# Patient Record
Sex: Male | Born: 1965 | Race: Black or African American | Hispanic: No | Smoking: Current every day smoker
Health system: Southern US, Community
[De-identification: ages and names within clinical notes are randomized; demographics above are authoritative.]

---

## 2010-10-02 ENCOUNTER — Emergency Department: Payer: Self-pay | Admitting: Emergency Medicine

## 2015-01-16 ENCOUNTER — Emergency Department: Payer: BLUE CROSS/BLUE SHIELD

## 2015-01-16 ENCOUNTER — Emergency Department
Admission: EM | Admit: 2015-01-16 | Discharge: 2015-01-16 | Disposition: A | Discharge status: Home / Self Care | Payer: BLUE CROSS/BLUE SHIELD | Attending: Student | Admitting: Student

## 2015-01-16 ENCOUNTER — Encounter: Payer: Self-pay | Admitting: *Deleted

## 2015-01-16 ENCOUNTER — Other Ambulatory Visit: Payer: Self-pay

## 2015-01-16 DIAGNOSIS — Z23 Encounter for immunization: Secondary | ICD-10-CM | POA: Insufficient documentation

## 2015-01-16 DIAGNOSIS — Y998 Other external cause status: Secondary | ICD-10-CM | POA: Insufficient documentation

## 2015-01-16 DIAGNOSIS — S0990XA Unspecified injury of head, initial encounter: Secondary | ICD-10-CM | POA: Diagnosis not present

## 2015-01-16 DIAGNOSIS — S0031XA Abrasion of nose, initial encounter: Secondary | ICD-10-CM | POA: Diagnosis not present

## 2015-01-16 DIAGNOSIS — R52 Pain, unspecified: Secondary | ICD-10-CM

## 2015-01-16 DIAGNOSIS — W010XXA Fall on same level from slipping, tripping and stumbling without subsequent striking against object, initial encounter: Secondary | ICD-10-CM | POA: Diagnosis not present

## 2015-01-16 DIAGNOSIS — Y92524 Gas station as the place of occurrence of the external cause: Secondary | ICD-10-CM | POA: Insufficient documentation

## 2015-01-16 DIAGNOSIS — R55 Syncope and collapse: Secondary | ICD-10-CM

## 2015-01-16 DIAGNOSIS — W19XXXA Unspecified fall, initial encounter: Secondary | ICD-10-CM

## 2015-01-16 DIAGNOSIS — Y9301 Activity, walking, marching and hiking: Secondary | ICD-10-CM | POA: Diagnosis not present

## 2015-01-16 DIAGNOSIS — S0081XA Abrasion of other part of head, initial encounter: Secondary | ICD-10-CM | POA: Insufficient documentation

## 2015-01-16 DIAGNOSIS — Z720 Tobacco use: Secondary | ICD-10-CM | POA: Diagnosis not present

## 2015-01-16 LAB — CBC
HEMATOCRIT: 51.2 % (ref 40.0–52.0)
Hemoglobin: 17.2 g/dL (ref 13.0–18.0)
MCH: 29.1 pg (ref 26.0–34.0)
MCHC: 33.5 g/dL (ref 32.0–36.0)
MCV: 86.7 fL (ref 80.0–100.0)
Platelets: 149 10*3/uL — ABNORMAL LOW (ref 150–440)
RBC: 5.9 MIL/uL (ref 4.40–5.90)
RDW: 16.6 % — ABNORMAL HIGH (ref 11.5–14.5)
WBC: 4.6 10*3/uL (ref 3.8–10.6)

## 2015-01-16 LAB — BASIC METABOLIC PANEL
Anion gap: 9 (ref 5–15)
BUN: 18 mg/dL (ref 6–20)
CALCIUM: 9.1 mg/dL (ref 8.9–10.3)
CHLORIDE: 104 mmol/L (ref 101–111)
CO2: 23 mmol/L (ref 22–32)
CREATININE: 1.41 mg/dL — AB (ref 0.61–1.24)
GFR calc Af Amer: >60 mL/min (ref >60)
GFR calc non Af Amer: 57 mL/min — ABNORMAL LOW (ref >60)
GLUCOSE: 193 mg/dL — AB (ref 65–99)
Potassium: 3.8 mmol/L (ref 3.5–5.1)
SODIUM: 136 mmol/L (ref 135–145)

## 2015-01-16 LAB — TROPONIN I

## 2015-01-16 MED ORDER — TETANUS-DIPHTH-ACELL PERTUSSIS 5-2.5-18.5 LF-MCG/0.5 IM SUSP
0.5000 mL | Freq: Once | INTRAMUSCULAR | Status: AC
Start: 2015-01-16 — End: 2015-01-16
  Administered 2015-01-16: 0.5 mL via INTRAMUSCULAR
  Filled 2015-01-16: qty 0.5

## 2015-01-16 MED ORDER — IBUPROFEN 600 MG PO TABS: 600.0000 mg | ORAL_TABLET | Freq: Four times a day (QID) | ORAL | Status: AC | PRN

## 2015-01-16 MED ORDER — SODIUM CHLORIDE 0.9 % IV BOLUS (SEPSIS)
1000.0000 mL | Freq: Once | INTRAVENOUS | Status: AC
  Administered 2015-01-16: 1000 mL via INTRAVENOUS

## 2015-01-16 NOTE — ED Provider Notes (Signed)
[facility] Emergency Department Provider Note  ____________________________________________  Time seen: Approximately 3:05 PM  I have reviewed the triage vital signs and the nursing notes.   HISTORY  Chief Complaint Loss of Consciousness    HPI Curtis Castaneda is a 49 y.o. male with no chronic medical problems presents for evaluation of syncope and fall which occurred suddenly just prior to arrival. Patient reports that he donated plasma earlier today. He left the donation facility, was walking into a gas station to purchase Gatorade and while he was walking began feeling lightheaded, "hot all over" with tunnel vision. He fainted, fell and hit his head and right face. Currently complaining of head/face pain. No chest pain, difficulty breathing or history of fainting in the past. Current severity of pain is mild. He is not eaten all day and has had minimal fluids to drink. He denies any abdominal pain, no recent illness including no fevers, chills. No modifying factors. Prior to today he had been in his usual state of health. Per report, he was hypotensive on EMS arrival however this improved with normal saline.   History reviewed. No pertinent past medical history.  There are no active problems to display for this patient.   History reviewed. No pertinent past surgical history.  No current outpatient prescriptions on file.  Allergies Review of patient's allergies indicates no known allergies.  History reviewed. No pertinent family history.  Social History History  Substance Use Topics  . Smoking status: Current Every Day Smoker -- 0.50 packs/day    Types: Cigarettes  . Smokeless tobacco: Not on file  . Alcohol Use: 0.6 oz/week    1 Cans of beer per week    Review of Systems Constitutional: No fever/chills Eyes: No visual changes. ENT: No sore throat. Cardiovascular: Denies chest pain. Respiratory: Denies shortness of breath. Gastrointestinal: No  abdominal pain.  No nausea, no vomiting.  No diarrhea.  No constipation. Genitourinary: Negative for dysuria. Musculoskeletal: Negative for back pain. Skin: Negative for rash. Neurological: Positive for moderate headache, no focal weakness or numbness.  10-point ROS otherwise negative.  ____________________________________________   PHYSICAL EXAM:  VITAL SIGNS: ED Triage Vitals  Enc Vitals Group     BP --      Pulse --      Resp --      Temp --      Temp src --      SpO2 01/16/15 1446 94 %     Weight --      Height --      Head Cir --      Peak Flow --      Pain Score 01/16/15 1454 3     Pain Loc --      Pain Edu? --      Excl. in GC? --     Constitutional: Alert and oriented x 4. Well appearing and in no acute distress. C-collar in place. Eyes: Conjunctivae are normal. PERRL. EOMI. Head: Abrasions and swelling associated with the anterior chin, abrasion just inferior to the nose. Nose: No congestion/rhinnorhea. Mouth/Throat: Mucous membranes are moist.  Oropharynx non-erythematous. No malocclusion. Chronically poor dentition with several teeth which are loose however patient reports this is chronic. Neck: No stridor.  No cervical spine tenderness to palpation. Cardiovascular: Normal rate, regular rhythm. Grossly normal heart sounds.  Good peripheral circulation. Respiratory: Normal respiratory effort.  No retractions. Lungs CTAB. Gastrointestinal: Soft and nontender. No distention. No abdominal bruits. No CVA tenderness. Genitourinary: deferred Musculoskeletal: No  lower extremity tenderness nor edema.  No joint effusions. Superficial abrasions to the palm of the left hand with mild associated tenderness. Neurologic:  Normal speech and language. No gross focal neurologic deficits are appreciated. 5 out of 5 strength in bilateral upper and lower extremities, sensation intact to light touch throughout. Skin:  Skin is warm, dry and intact. No rash noted. Psychiatric: Mood  and affect are normal. Speech and behavior are normal.  ____________________________________________   LABS (all labs ordered are listed, but only abnormal results are displayed)  Labs Reviewed  BASIC METABOLIC PANEL - Abnormal; Notable for the following:    Glucose, Bld 193 (*)    Creatinine, Ser 1.41 (*)    GFR calc non Af Amer 57 (*)    All other components within normal limits  CBC - Abnormal; Notable for the following:    RDW 16.6 (*)    Platelets 149 (*)    All other components within normal limits  URINALYSIS COMPLETEWITH MICROSCOPIC ([facility] ONLY)  TROPONIN I   ____________________________________________  EKG  ED ECG REPORT I, Gayla Doss, the attending physician, personally viewed and interpreted this ECG.   Date: 01/16/2015  EKG Time: 14:54  Rate: 84  Rhythm: normal sinus rhythm  Axis: Normal axis  Intervals:none  ST&T Change: No acute ST segment elevation, normal QTC. Nonspecific T-wave inversion in lead 3  ____________________________________________  RADIOLOGY  CT head, c-spine, maxillofacial IMPRESSION: 1. Normal CT appearance the brain. 2. Soft tissue swelling anterior to the mandible without an underlying fracture or dislocation. 3. No acute fracture of the face. 4. Dental caries and periodontal disease as described. 5. Mild degenerative changes in the cervical spine at C5-6 without significant focal stenosis.   CXR  FINDINGS: The heart size and mediastinal contours are within normal limits. Both lungs are clear. The visualized skeletal structures are unremarkable.  IMPRESSION: No active disease.    Xray left hand FINDINGS: The hand and wrist are located. No acute bone or soft tissue abnormality is present. Mild degenerative changes are noted at the DIP of the left index finger.  IMPRESSION: 1. No acute abnormality. 2. Minimal degenerative change.  ____________________________________________   PROCEDURES  Procedure(s)  performed: None  Critical Care performed: No  ____________________________________________   INITIAL IMPRESSION / ASSESSMENT AND PLAN / ED COURSE  Pertinent labs & imaging results that were available during my care of the patient were reviewed by me and considered in my medical decision making (see chart for details).  Curtis Castaneda is a 49 y.o. male with no chronic medical problems presents for evaluation of syncope and fall which occurred suddenly just prior to arrival. On exam, he is well-appearing and in no acute distress. Vital signs stable, he is afebrile. He has superficial hemostatic abrasion associated with the chin and the left hand however his exam is otherwise benign. CT head, C-spine, maxillofacial negative for any acute traumatic pathology. C-collar cleared. Suspect dehydration with vasovagal syncope in the setting of orthostatic hypotension given poor oral intake today, plasma donation. No chest pain, no difficulty breathing, no card arrhythmia noted on monitor and I doubt purely cardiogenic syncope. Additionally, he has an intact neurological exam and I doubt purely neurogenic syncope. IV fluids given for mild creatinine elevation and suspected dehydration. Troponin negative. Patient reports he feels much better at this time. Encouraged adequate oral hydration. Tetanus updated. DC with return precautions, PCP follow-up. He is comfortable with the discharge plan. ____________________________________________   FINAL CLINICAL IMPRESSION(S) / ED DIAGNOSES  Final diagnoses:  Fall  Syncope, vasovagal  Head injury without skull fracture, initial encounter      Gayla Doss, MD 01/16/15 [phone]

## 2015-01-16 NOTE — ED Notes (Signed)
Pt gave plasma earlier today, was walking to the store to get some Gatorade, just before the store the pt syncope out hitting rt jaw and face.  C-Collar in place per EMS, 18G PIV NS given, EMS reports SBP in the low 60's,  Pt is currently 99/71, abrasions noted to chin, bleeding controlled. Teeth appear to be in proper place.

## 2016-06-24 ENCOUNTER — Emergency Department
Admission: EM | Admit: 2016-06-24 | Discharge: 2016-06-24 | Disposition: A | Discharge status: Home / Self Care | Payer: BLUE CROSS/BLUE SHIELD | Attending: Student in an Organized Health Care Education/Training Program | Admitting: Student in an Organized Health Care Education/Training Program

## 2016-06-24 DIAGNOSIS — R55 Syncope and collapse: Secondary | ICD-10-CM

## 2016-06-24 DIAGNOSIS — E860 Dehydration: Secondary | ICD-10-CM

## 2016-06-24 DIAGNOSIS — F17210 Nicotine dependence, cigarettes, uncomplicated: Secondary | ICD-10-CM | POA: Insufficient documentation

## 2016-06-24 LAB — BASIC METABOLIC PANEL
Anion gap: 7 (ref 5–15)
BUN: 14 mg/dL (ref 6–20)
CHLORIDE: 108 mmol/L (ref 101–111)
CO2: 26 mmol/L (ref 22–32)
Calcium: 8.7 mg/dL — ABNORMAL LOW (ref 8.9–10.3)
Creatinine, Ser: 1.39 mg/dL — ABNORMAL HIGH (ref 0.61–1.24)
GFR calc Af Amer: >60 mL/min (ref >60)
GFR calc non Af Amer: 57 mL/min — ABNORMAL LOW (ref >60)
Glucose, Bld: 117 mg/dL — ABNORMAL HIGH (ref 65–99)
Potassium: 4.4 mmol/L (ref 3.5–5.1)
Sodium: 141 mmol/L (ref 135–145)

## 2016-06-24 LAB — CBC
HCT: 52.9 % — ABNORMAL HIGH (ref 40.0–52.0)
Hemoglobin: 17.6 g/dL (ref 13.0–18.0)
MCH: 29.9 pg (ref 26.0–34.0)
MCHC: 33.3 g/dL (ref 32.0–36.0)
MCV: 90 fL (ref 80.0–100.0)
PLATELETS: 167 10*3/uL (ref 150–440)
RBC: 5.88 MIL/uL (ref 4.40–5.90)
RDW: 15.8 % — AB (ref 11.5–14.5)
WBC: 6.4 10*3/uL (ref 3.8–10.6)

## 2016-06-24 MED ORDER — SODIUM CHLORIDE 0.9 % IV BOLUS (SEPSIS)
1000.0000 mL | Freq: Once | INTRAVENOUS | Status: AC
  Administered 2016-06-24: 1000 mL via INTRAVENOUS

## 2016-06-24 NOTE — ED Provider Notes (Signed)
[facility] Emergency Department Provider Note    First MD Initiated Contact with Patient 06/24/16 1730     (approximate)  I have reviewed the triage vital signs and the nursing notes.   HISTORY  Chief Complaint Loss of Consciousness    HPI Curtis Castaneda is a 51 y.o. male presents with lightheadedness and near syncopal event after giving plasma today around 2 PM. Patient states he typically gives plasma roughly 2 times weekly. Has had similar episodes in the past. States that when he was standing up at work patient felt lightheaded with blurry vision. A coworker saw him become pale and looked as though he is about to pass out and he helped him to the floor. Once lying down he had improvement in his symptoms and regained full consciousness. He denied any chest pain or shortness of breath. No abdominal pain. Denies any numbness or tingling. There was no full loss of consciousness or head injury.   PMH:  No h/o renal failure FMH: no bleeding disorders [facility]: no recent surgeries There are no active problems to display for this patient.     Prior to Admission medications   Medication Sig Start Date End Date Taking? Authorizing Provider  acetaminophen (TYLENOL) 500 MG tablet Take 1,000 mg by mouth every 6 (six) hours as needed for mild pain.    Historical Provider, MD  ibuprofen (ADVIL,MOTRIN) 600 MG tablet Take 1 tablet (600 mg total) by mouth every 6 (six) hours as needed for moderate pain. 01/16/15   Curtis DossEryka A Gayle, MD    Allergies Patient has no known allergies.    Social History Social History  Substance Use Topics  . Smoking status: Current Every Day Smoker    Packs/day: 0.50    Types: Cigarettes  . Smokeless tobacco: Not on file  . Alcohol use 0.6 oz/week    1 Cans of beer per week    Review of Systems Patient denies headaches, rhinorrhea, blurry vision, numbness, shortness of breath, chest pain, edema, cough, abdominal pain, nausea, vomiting,  diarrhea, dysuria, fevers, rashes or hallucinations unless otherwise stated above in HPI. ____________________________________________   PHYSICAL EXAM:  VITAL SIGNS: Vitals:   06/24/16 1730  BP: 108/72  Pulse: 81  Resp: 18  Temp: 97.4 F (36.3 C)    Constitutional: Alert and oriented. Well appearing and in no acute distress. Eyes: Conjunctivae are normal. PERRL. EOMI. Head: Atraumatic. Nose: No congestion/rhinnorhea. Mouth/Throat: Mucous membranes are moist.  Oropharynx non-erythematous. Neck: No stridor. Painless ROM. No cervical spine tenderness to palpation Hematological/Lymphatic/Immunilogical: No cervical lymphadenopathy. Cardiovascular: Normal rate, regular rhythm. Grossly normal heart sounds.  Good peripheral circulation. Respiratory: Normal respiratory effort.  No retractions. Lungs CTAB. Gastrointestinal: Soft and nontender. No distention. No abdominal bruits. No CVA tenderness. Genitourinary:  Musculoskeletal: No lower extremity tenderness nor edema.  No joint effusions. Neurologic:  Normal speech and language. No gross focal neurologic deficits are appreciated. No gait instability. Skin:  Skin is warm, dry and intact. No rash noted. Psychiatric: Mood and affect are normal. Speech and behavior are normal.  ____________________________________________   LABS (all labs ordered are listed, but only abnormal results are displayed)  Results for orders placed or performed during the hospital encounter of 06/24/16 (from the past 24 hour(s))  Basic metabolic panel     Status: Abnormal   Collection Time: 06/24/16  5:30 PM  Result Value Ref Range   Sodium 141 135 - 145 mmol/L   Potassium 4.4 3.5 - 5.1 mmol/L  Chloride 108 101 - 111 mmol/L   CO2 26 22 - 32 mmol/L   Glucose, Bld 117 (H) 65 - 99 mg/dL   BUN 14 6 - 20 mg/dL   Creatinine, Ser 6.96 (H) 0.61 - 1.24 mg/dL   Calcium 8.7 (L) 8.9 - 10.3 mg/dL   GFR calc non Af Amer 57 (L) >60 mL/min   GFR calc Af Amer >60 >60  mL/min   Anion gap 7 5 - 15  CBC     Status: Abnormal   Collection Time: 06/24/16  5:30 PM  Result Value Ref Range   WBC 6.4 3.8 - 10.6 K/uL   RBC 5.88 4.40 - 5.90 MIL/uL   Hemoglobin 17.6 13.0 - 18.0 g/dL   HCT 29.5 (H) 28.4 - 13.2 %   MCV 90.0 80.0 - 100.0 fL   MCH 29.9 26.0 - 34.0 pg   MCHC 33.3 32.0 - 36.0 g/dL   RDW 44.0 (H) 10.2 - 72.5 %   Platelets 167 150 - 440 K/uL   ____________________________________________  EKG My review and personal interpretation at Time: 17:35   Indication: near syncope  Rate: 80  Rhythm: sinus Axis: normal Other: RAE, normal intervals, non specific st changes, no acute ischemia ____________________________________________  RADIOLOGY   ____________________________________________   PROCEDURES  Procedure(s) performed:  Procedures    Critical Care performed: no ____________________________________________   INITIAL IMPRESSION / ASSESSMENT AND PLAN / ED COURSE  Pertinent labs & imaging results that were available during my care of the patient were reviewed by me and considered in my medical decision making (see chart for details).  DDX: orthostasis, dehydration, seizure, vasovagal, dysrhythmia  Curtis Castaneda is a 51 y.o. who presents to the ED with near syncopal event. He arrives afebrile well-appearing. Patient has no complaints at this time. Initial vitals via EMS showed borderline hypotension and given his plasma donation today do feel presentation most likely secondary to orthostasis. We'll provide IV fluids. We'll check electrolytes to evaluate for any acute abnormality. His EKG shows no evidence of dysrhythmia, WPW or Brugada. Denies any chest pain to suggest any sort of primary cardiac event.  The patient will be placed on continuous pulse oximetry and telemetry for monitoring.  Laboratory evaluation will be sent to evaluate for the above complaints.     Clinical Course as of Jun 25 40  Thu Jun 24, 2016  1851 Blood work is  reassuring. Patient able to ambulate a steady gait. Symptoms improved after IV fluids and patient is no longer orthostatic. He is tolerated oral hydration and stable for discharge home.  Have discussed with the patient and available family all diagnostics and treatments performed thus far and all questions were answered to the best of my ability. The patient demonstrates understanding and agreement with plan.   [PR]    Clinical Course User Index [PR] Willy Eddy, MD     ____________________________________________   FINAL CLINICAL IMPRESSION(S) / ED DIAGNOSES  Final diagnoses:  Near syncope  Dehydration      NEW MEDICATIONS STARTED DURING THIS VISIT:  New Prescriptions   No medications on file     Note:  This document was prepared using Dragon voice recognition software and may include unintentional dictation errors.    Willy Eddy, MD 06/25/16 [phone]

## 2016-06-24 NOTE — ED Triage Notes (Signed)
Pt arrived via EMS from work. Pt gave plasma today at 2 pm. Pt had two syncopal episodes at work. EMS reports BP lying as 109/ and standing as 92/. EMS reports last BP 110/70. EMS administered NS 200 mL PTA.

## 2016-06-24 NOTE — ED Notes (Signed)
AAOx3.  Skin warm and dry. NAD.  Ambulates with easy and steady gait.   

## 2016-06-24 NOTE — ED Notes (Signed)
Dinner tray given to patient.  Remains AAOx3.  Skin warm and dry. NAD

## 2017-02-01 IMAGING — CR DG CHEST 1V PORT
1 series · 1 of 1 positions shown · non-contrast
Comparison: None.

CLINICAL DATA: Recent syncopal event following plasma donation,
initial encounter

EXAM:
PORTABLE CHEST - 1 VIEW

[ap]
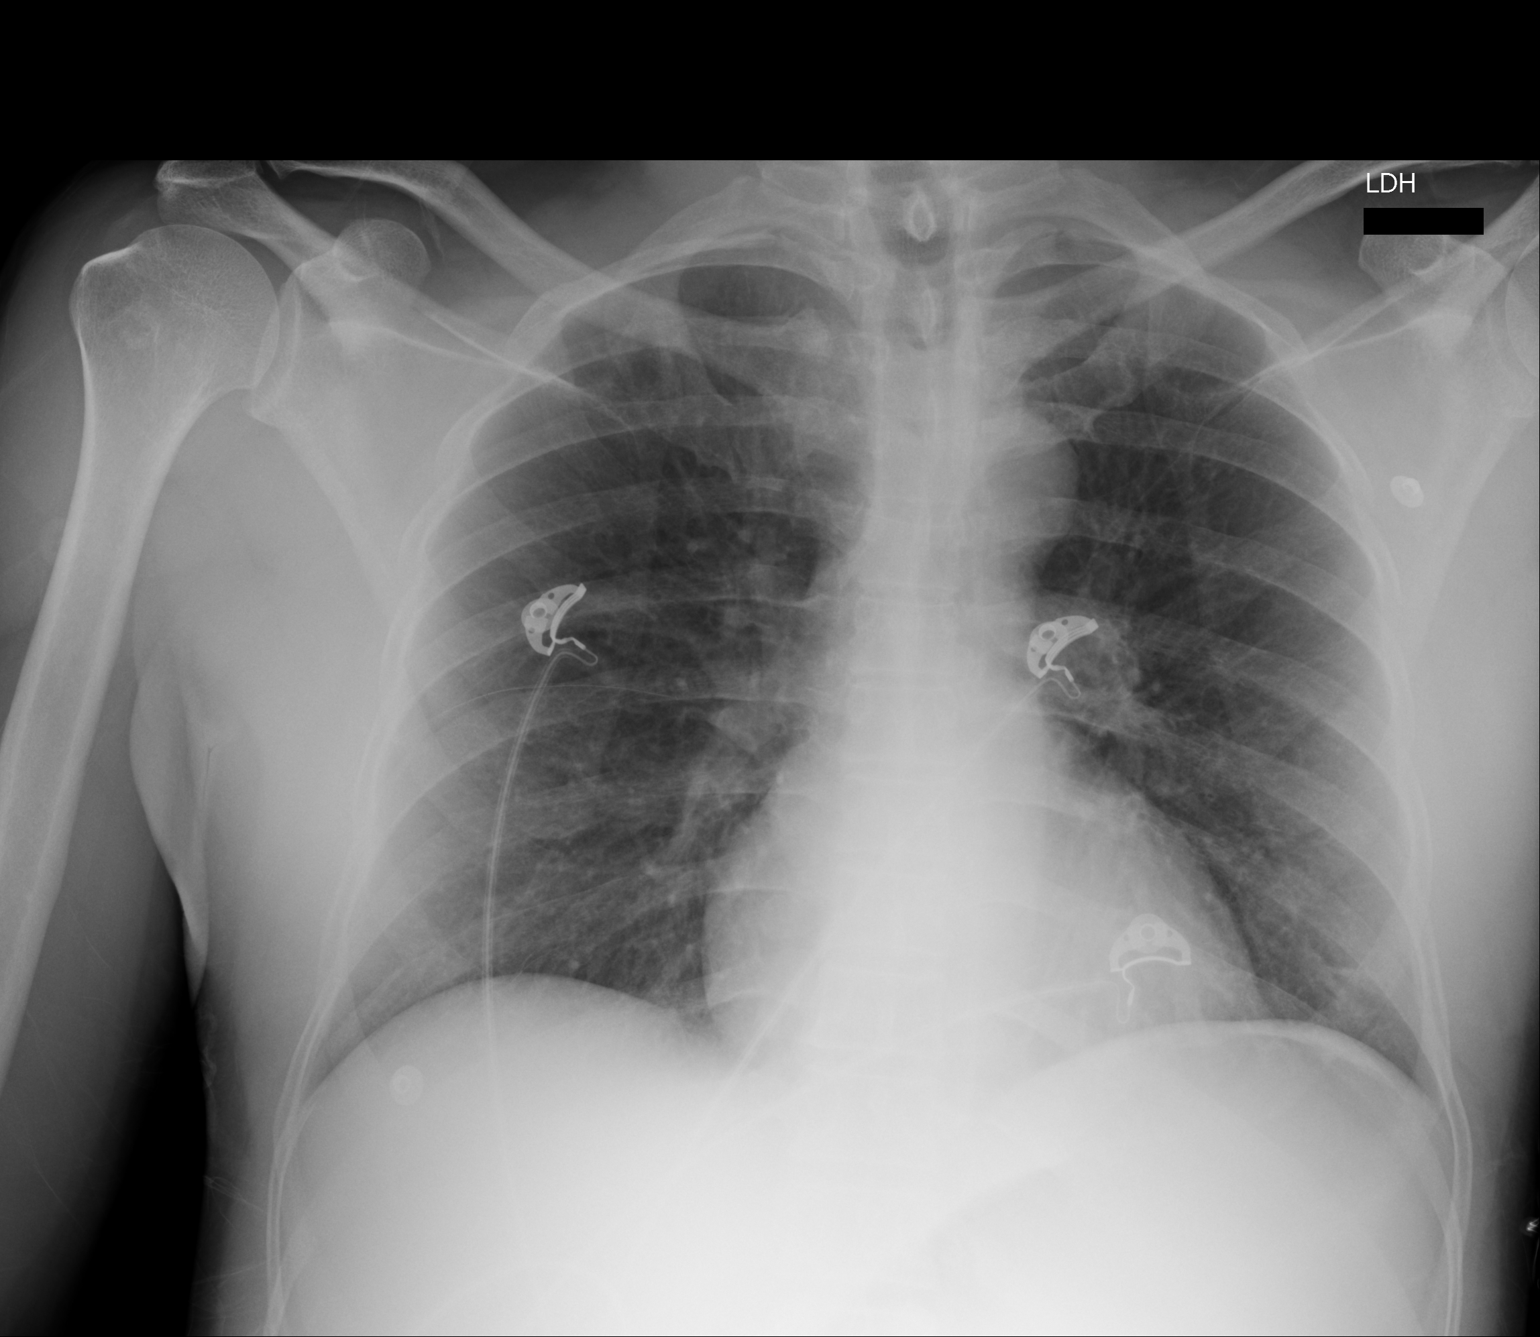

[1 of 1 positions shown; findings below may reference images not displayed]

FINDINGS: The heart size and mediastinal contours are within normal limits.
Both lungs are clear. The visualized skeletal structures are
unremarkable.
IMPRESSION: No active disease.

## 2017-02-01 IMAGING — CR DG HAND COMPLETE 3+V*L*
1 series · 3 of 3 positions shown · non-contrast
Comparison: None.

CLINICAL DATA: Syncopal episode after giving plasma. Fell on hand.
Hand pain.

EXAM:
LEFT HAND - COMPLETE 3+ VIEW

[Series 1: pa · 0.17mm/px · 3 of 3 slices shown]
[im 1/3]
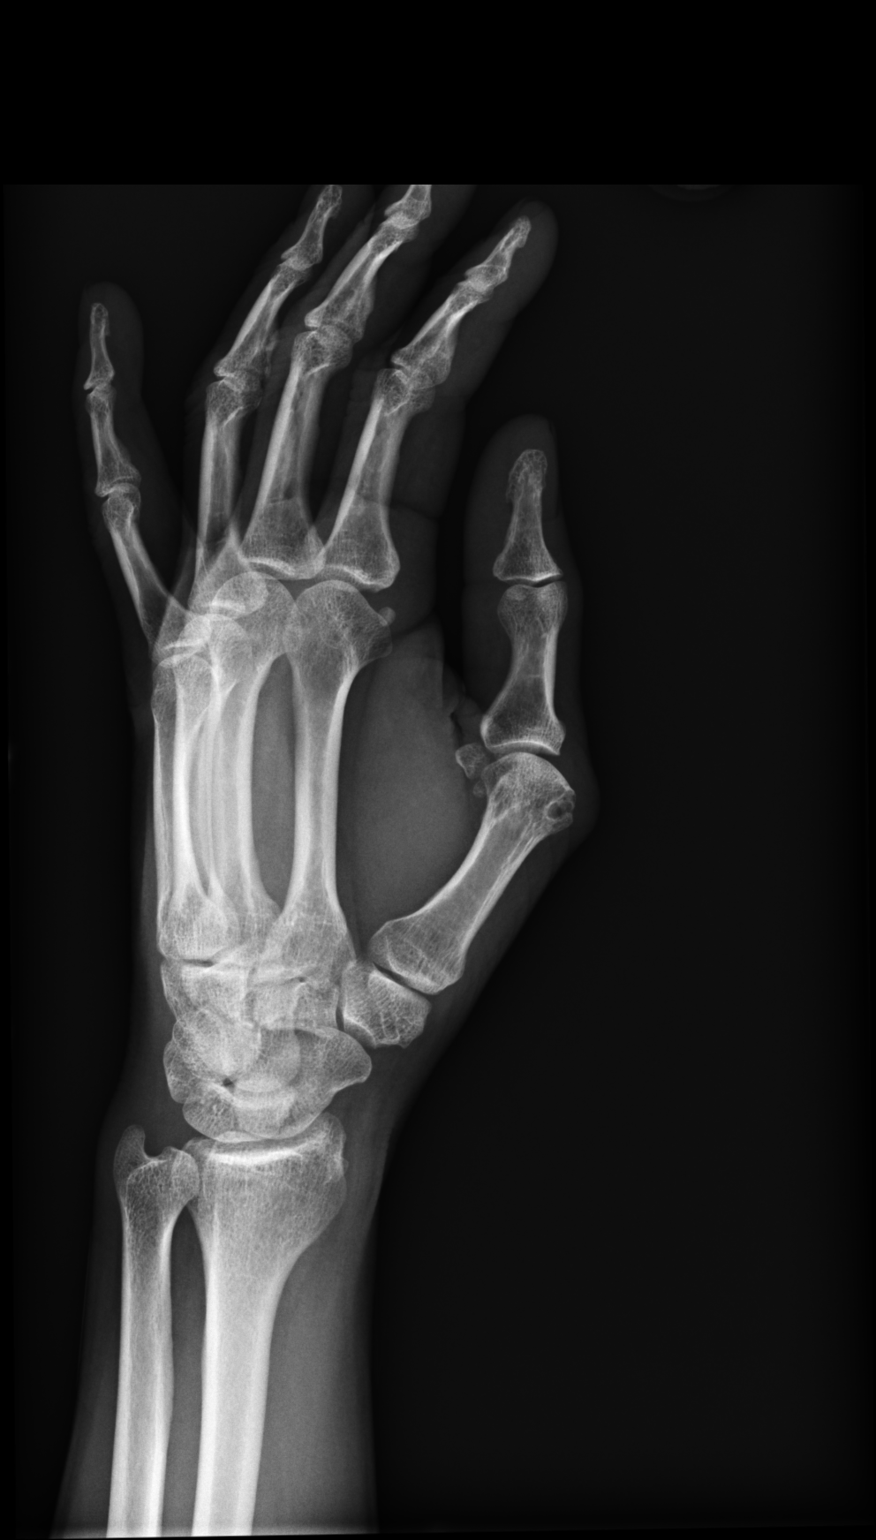
[im 2/3]
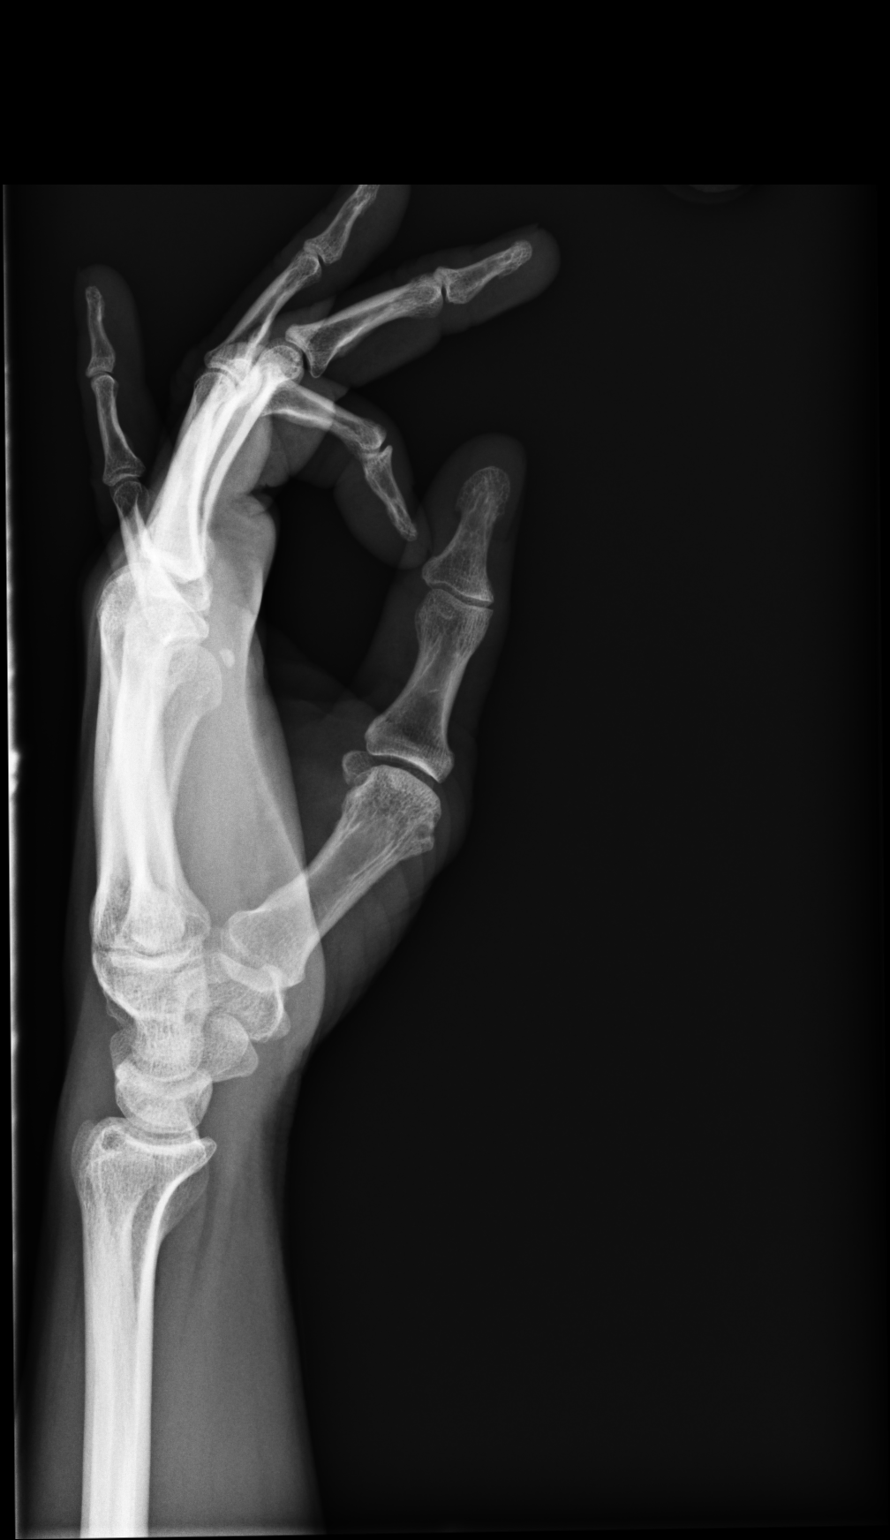
[im 3/3]
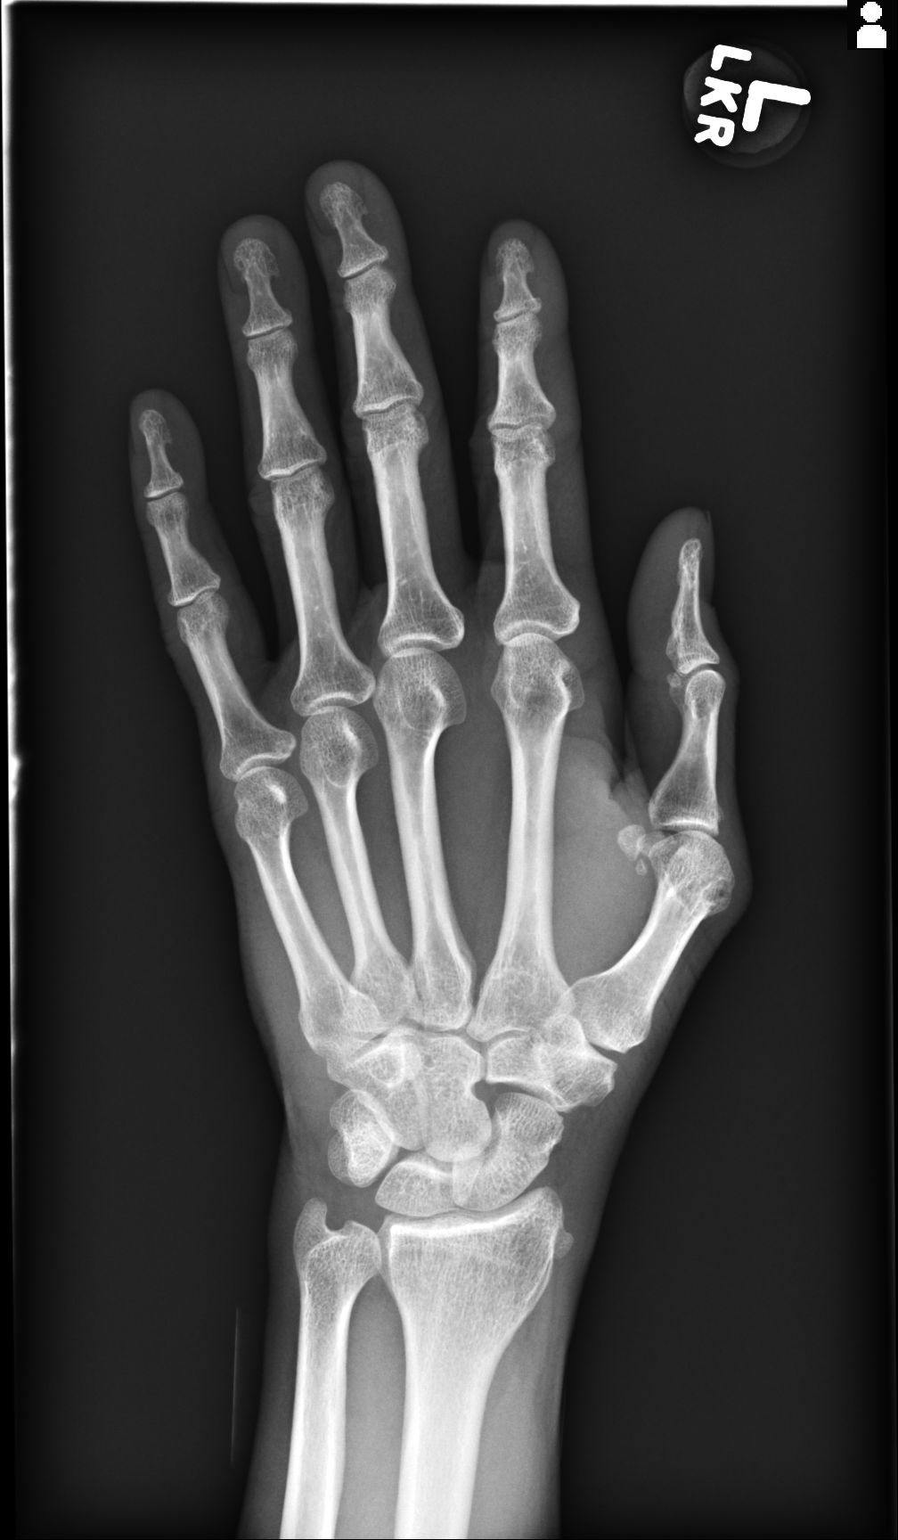

[3 of 3 positions shown; findings below may reference images not displayed]

FINDINGS: The hand and wrist are located. No acute bone or soft tissue
abnormality is present. Mild degenerative changes are noted at the
DIP of the left index finger.
IMPRESSION: 1. No acute abnormality.
2. Minimal degenerative change.

## 2017-03-18 ENCOUNTER — Emergency Department
Admission: EM | Admit: 2017-03-18 | Discharge: 2017-03-18 | Disposition: A | Discharge status: Home / Self Care | Payer: BLUE CROSS/BLUE SHIELD | Attending: Emergency Medicine | Admitting: Emergency Medicine

## 2017-03-18 ENCOUNTER — Encounter: Payer: Self-pay | Admitting: Emergency Medicine

## 2017-03-18 DIAGNOSIS — I959 Hypotension, unspecified: Secondary | ICD-10-CM | POA: Diagnosis not present

## 2017-03-18 DIAGNOSIS — R42 Dizziness and giddiness: Secondary | ICD-10-CM | POA: Diagnosis not present

## 2017-03-18 DIAGNOSIS — I951 Orthostatic hypotension: Secondary | ICD-10-CM

## 2017-03-18 DIAGNOSIS — R55 Syncope and collapse: Secondary | ICD-10-CM | POA: Diagnosis not present

## 2017-03-18 DIAGNOSIS — F17210 Nicotine dependence, cigarettes, uncomplicated: Secondary | ICD-10-CM | POA: Diagnosis not present

## 2017-03-18 LAB — CBC
HEMATOCRIT: 42.8 % (ref 40.0–52.0)
Hemoglobin: 14.6 g/dL (ref 13.0–18.0)
MCH: 30.2 pg (ref 26.0–34.0)
MCHC: 34.1 g/dL (ref 32.0–36.0)
MCV: 88.7 fL (ref 80.0–100.0)
PLATELETS: 174 10*3/uL (ref 150–440)
RBC: 4.82 MIL/uL (ref 4.40–5.90)
RDW: 15.1 % — AB (ref 11.5–14.5)
WBC: 6.8 10*3/uL (ref 3.8–10.6)

## 2017-03-18 LAB — BASIC METABOLIC PANEL
Anion gap: 10 (ref 5–15)
BUN: 12 mg/dL (ref 6–20)
CO2: 25 mmol/L (ref 22–32)
Calcium: 8.6 mg/dL — ABNORMAL LOW (ref 8.9–10.3)
Chloride: 105 mmol/L (ref 101–111)
Creatinine, Ser: 1.25 mg/dL — ABNORMAL HIGH (ref 0.61–1.24)
Glucose, Bld: 83 mg/dL (ref 65–99)
POTASSIUM: 3.7 mmol/L (ref 3.5–5.1)
SODIUM: 140 mmol/L (ref 135–145)

## 2017-03-18 LAB — TROPONIN I: Troponin I: <0.03 ng/mL (ref <0.03)

## 2017-03-18 MED ORDER — SODIUM CHLORIDE 0.9 % IV BOLUS (SEPSIS)
1000.0000 mL | Freq: Once | INTRAVENOUS | Status: AC
  Administered 2017-03-18: 1000 mL via INTRAVENOUS

## 2017-03-18 NOTE — ED Notes (Signed)
Pt eating his dinner tray at this time. Will continue to monitor for changes.

## 2017-03-18 NOTE — ED Provider Notes (Signed)
[facility] Emergency Department Provider Note   ____________________________________________   First MD Initiated Contact with Patient 03/18/17 1519     (approximate)  I have reviewed the triage vital signs and the nursing notes.   HISTORY  Chief Complaint Loss of Consciousness    HPI Curtis Castaneda is a 51 y.o. male reports that he got out of bed this morning, he stood up very quickly and passed out. He reports that he try to get himself up after that, felt lightheaded and then passed out a second time possibly. Reports are both very brief. He felt very lightheaded before. He is otherwise been in his normal health, but reports this happen once before because he gives plasma once or twice a week.  No chest pain. No nausea or vomiting. No shortness of breath. Denies any recent illness or fevers. Denies having any known medical history other than passing out once before  no numbness or weakness. No headache. No neck pain. No trouble speaking. No numbness or tingling  History reviewed. No pertinent past medical history.  There are no active problems to display for this patient.   History reviewed. No pertinent surgical history.  Prior to Admission medications   Medication Sig Start Date End Date Taking? Authorizing Provider  ibuprofen (ADVIL,MOTRIN) 600 MG tablet Take 1 tablet (600 mg total) by mouth every 6 (six) hours as needed for moderate pain. Patient not taking: Reported on 06/24/2016 01/16/15   Gayla Doss, MD    Allergies Patient has no known allergies.  History reviewed. No pertinent family history.  Social History Social History  Substance Use Topics  . Smoking status: Current Every Day Smoker    Packs/day: 0.50    Types: Cigarettes  . Smokeless tobacco: Never Used  . Alcohol use 0.6 oz/week    1 Cans of beer per week    Review of Systems Constitutional: No fever/chills Eyes: No visual changes. ENT: No sore  throat. Cardiovascular: Denies chest pain. Respiratory: Denies shortness of breath. Gastrointestinal: No abdominal pain.  No nausea, no vomiting.   Genitourinary: Negative for dysuria. Musculoskeletal: Negative for neck or back pain Skin: Negative for rash. Neurological: Negative for headaches, focal weakness or numbness.    ____________________________________________   PHYSICAL EXAM:  VITAL SIGNS: ED Triage Vitals [03/18/17 1419]  Enc Vitals Group     BP 103/70     Pulse Rate (!) 56     Resp (!) 23     Temp 98.2 F (36.8 C)     Temp src      SpO2 96 %     Weight      Height      Head Circumference      Peak Flow      Pain Score      Pain Loc      Pain Edu?      Excl. in GC?     Constitutional: Alert and oriented. Well appearing and in no acute distress. Eyes: Conjunctivae are normal. Head: Atraumatic. Nose: No congestion/rhinnorhea. Mouth/Throat: Mucous membranes are slightly dry. Neck: No stridor.   Cardiovascular: Normal rate, regular rhythm. Grossly normal heart sounds.  Good peripheral circulation. Respiratory: Normal respiratory effort.  No retractions. Lungs CTAB. Gastrointestinal: Soft and nontender. No distention. Musculoskeletal: No lower extremity tenderness nor edema. Neurologic:  Normal speech and language. No gross focal neurologic deficits are appreciated. moves all extremities normal strength. Equal smile. No pronator drift Skin:  Skin is warm, dry and intact.  No rash noted. Psychiatric: Mood and affect are normal. Speech and behavior are normal.  ____________________________________________   LABS (all labs ordered are listed, but only abnormal results are displayed)  Labs Reviewed  BASIC METABOLIC PANEL - Abnormal; Notable for the following:       Result Value   Creatinine, Ser 1.25 (*)    Calcium 8.6 (*)    All other components within normal limits  CBC - Abnormal; Notable for the following:    RDW 15.1 (*)    All other components  within normal limits  TROPONIN I  URINALYSIS, COMPLETE (UACMP) WITH MICROSCOPIC  CBG MONITORING, ED   ____________________________________________  EKG  reviewed and interpreted by me at 1415 Heart rate 55 QRS 80 QTc 400 Normal sinus rhythm, minimal T-wave abnormality noted in 3 and aVF which is nonspecific, although slightly biphasic appearance in V3 and V4  compared to previous EKG from 06/24/2016, no significant changes are noted ____________________________________________  RADIOLOGY  no indication for CT imaging. No chest symptoms. No head symptoms. ____________________________________________   PROCEDURES  Procedure(s) performed: None  Procedures  Critical Care performed: No  ____________________________________________   INITIAL IMPRESSION / ASSESSMENT AND PLAN / ED COURSE  Pertinent labs & imaging results that were available during my care of the patient were reviewed by me and considered in my medical decision making (see chart for details).  patient presents for syncope. No deformity orthostatic previous. Symptoms sound like that of syncope probably secondary to hypotensive event, possibly related to his plasma donation. He has no cardiac or pulmonary symptoms. Denies any recent infectious illness. Electrolytes and lab work are reassuring. He denies any ongoing symptoms now, is requesting to eat. I plan to hydrate him and reassess thereafter but suspect likely syncope due to volume loss secondary to his plasma donations, and also possibly due to not having eaten or drank anything yet this morning when he got out of bed.    ----------------------------------------- 5:40 PM on 03/18/2017 -----------------------------------------  Patient ambulatory, asymptomatic. Reports feeling well with no further lightheadedness. Return precautions and treatment recommendations and follow-up discussed with the patient who is agreeable with the  plan.   ____________________________________________   FINAL CLINICAL IMPRESSION(S) / ED DIAGNOSES  Final diagnoses:  Syncope due to orthostatic hypotension      NEW MEDICATIONS STARTED DURING THIS VISIT:  New Prescriptions   No medications on file     Note:  This document was prepared using Dragon voice recognition software and may include unintentional dictation errors.     Sharyn Creamer, MD 03/18/17 1740

## 2017-03-18 NOTE — Discharge Instructions (Signed)
You have been seen today in the Emergency Department (ED)  for syncope (passing out).  Your workup including labs and EKG show reassuring results.  Your symptoms may be due to dehydration, so it is important that you drink plenty of non-alcoholic fluids. Avoid giving plasma for the next 2 weeks.  Please call your regular doctor as soon as possible to schedule the next available clinic appointment to follow up with him/her regarding your visit to the ED and your symptoms.  Return to the Emergency Department (ED)  if you have any further syncopal episodes (pass out again) or develop ANY chest pain, pressure, tightness, trouble breathing, sudden sweating, or other symptoms that concern you.

## 2017-03-18 NOTE — ED Triage Notes (Signed)
Pt from home by EMS after having 2 syncopal episodes. EMS orthostatic BP 120/60 sitting and 98/60 upon standing. Per EMS pt gives plasma 2x / week regularly.

## 2017-03-18 NOTE — ED Notes (Signed)
Pt has NAD.VSS. Waiting for ride to arrive.

## 2017-03-18 NOTE — ED Notes (Signed)
Patient denies pain and is resting comfortably.
